# Patient Record
Sex: Female | Born: 1950 | Race: White | Hispanic: No | Smoking: Never smoker
Health system: Southern US, Community
[De-identification: ages and names within clinical notes are randomized; demographics above are authoritative.]

## PROBLEM LIST (undated history)

## (undated) DIAGNOSIS — Z8679 Personal history of other diseases of the circulatory system: Secondary | ICD-10-CM

## (undated) DIAGNOSIS — R002 Palpitations: Secondary | ICD-10-CM

## (undated) DIAGNOSIS — I10 Essential (primary) hypertension: Secondary | ICD-10-CM

## (undated) HISTORY — DX: Personal history of other diseases of the circulatory system: Z86.79

## (undated) HISTORY — DX: Essential (primary) hypertension: I10

## (undated) HISTORY — PX: TONSILLECTOMY: SUR1361

## (undated) HISTORY — DX: Palpitations: R00.2

---

## 2005-05-05 ENCOUNTER — Ambulatory Visit: Payer: Self-pay | Admitting: Cardiology

## 2005-05-27 ENCOUNTER — Ambulatory Visit: Payer: Self-pay

## 2005-05-27 ENCOUNTER — Encounter: Payer: Self-pay | Admitting: Internal Medicine

## 2008-02-09 ENCOUNTER — Encounter: Payer: Self-pay | Admitting: Cardiology

## 2008-02-09 ENCOUNTER — Ambulatory Visit: Payer: Self-pay | Admitting: Cardiology

## 2008-02-09 DIAGNOSIS — R002 Palpitations: Secondary | ICD-10-CM | POA: Insufficient documentation

## 2008-02-09 DIAGNOSIS — Z8679 Personal history of other diseases of the circulatory system: Secondary | ICD-10-CM | POA: Insufficient documentation

## 2008-02-09 DIAGNOSIS — I10 Essential (primary) hypertension: Secondary | ICD-10-CM | POA: Insufficient documentation

## 2008-02-16 ENCOUNTER — Encounter: Payer: Self-pay | Admitting: Cardiology

## 2008-03-18 ENCOUNTER — Encounter: Payer: Self-pay | Admitting: Cardiology

## 2008-03-18 ENCOUNTER — Ambulatory Visit: Payer: Self-pay | Admitting: Cardiology

## 2008-03-26 ENCOUNTER — Ambulatory Visit: Payer: Self-pay | Admitting: Cardiology

## 2008-03-26 LAB — CONVERTED CEMR LAB
CO2: 34 meq/L — ABNORMAL HIGH (ref 19–32)
Chloride: 102 meq/L (ref 96–112)
Glucose, Bld: 87 mg/dL (ref 70–99)
Potassium: 3.9 meq/L (ref 3.5–5.1)
Sodium: 143 meq/L (ref 135–145)

## 2008-05-14 ENCOUNTER — Ambulatory Visit: Payer: Self-pay | Admitting: Cardiology

## 2009-01-13 ENCOUNTER — Telehealth (INDEPENDENT_AMBULATORY_CARE_PROVIDER_SITE_OTHER): Payer: Self-pay | Admitting: *Deleted

## 2010-02-05 NOTE — Progress Notes (Signed)
Summary: Record request from [facility]  Request for records received from [facility]. Request forwarded to [facility]. Wilder Glade  January 13, 2009 12:49 PM

## 2010-05-24 ENCOUNTER — Other Ambulatory Visit: Payer: Self-pay | Admitting: Cardiology

## 2010-06-29 ENCOUNTER — Other Ambulatory Visit: Payer: Self-pay | Admitting: Cardiology

## 2011-04-15 ENCOUNTER — Ambulatory Visit
Admission: RE | Admit: 2011-04-15 | Discharge: 2011-04-15 | Disposition: A | Discharge status: Home / Self Care | Payer: Commercial Managed Care - PPO | Source: Ambulatory Visit | Attending: Family Medicine | Admitting: Family Medicine

## 2011-04-15 ENCOUNTER — Other Ambulatory Visit: Payer: Self-pay | Admitting: Family Medicine

## 2011-04-15 DIAGNOSIS — R52 Pain, unspecified: Secondary | ICD-10-CM

## 2011-04-20 ENCOUNTER — Other Ambulatory Visit: Payer: Self-pay | Admitting: Cardiology

## 2011-04-28 ENCOUNTER — Other Ambulatory Visit: Payer: Self-pay | Admitting: Family Medicine

## 2011-04-28 DIAGNOSIS — M79604 Pain in right leg: Secondary | ICD-10-CM

## 2011-04-28 DIAGNOSIS — M79605 Pain in left leg: Secondary | ICD-10-CM

## 2011-05-04 ENCOUNTER — Ambulatory Visit
Admission: RE | Admit: 2011-05-04 | Discharge: 2011-05-04 | Disposition: A | Discharge status: Home / Self Care | Payer: Commercial Managed Care - PPO | Source: Ambulatory Visit | Attending: Family Medicine | Admitting: Family Medicine

## 2011-05-04 DIAGNOSIS — M79604 Pain in right leg: Secondary | ICD-10-CM

## 2011-05-31 ENCOUNTER — Other Ambulatory Visit: Payer: Self-pay | Admitting: Cardiology

## 2012-01-12 ENCOUNTER — Other Ambulatory Visit: Payer: Self-pay | Admitting: Cardiology

## 2012-05-22 ENCOUNTER — Other Ambulatory Visit: Payer: Self-pay | Admitting: Cardiology

## 2012-05-23 NOTE — Telephone Encounter (Signed)
Haven't seen pt since 2010 last note states come back prn. Per Deliah Goody, Dr. Jens Som nurse if only coming back prn ok to send to [facility] MD will send a message to Pharmacy stating to refill through [facility]

## 2012-05-25 ENCOUNTER — Other Ambulatory Visit: Payer: Self-pay | Admitting: Cardiology

## 2012-05-26 ENCOUNTER — Telehealth: Payer: Self-pay | Admitting: *Deleted

## 2012-05-26 NOTE — Telephone Encounter (Signed)
Patient calling back to see if Dr Jens Som will refill her Metoprolol. Sh ehas not been seen since 2010 and wants a call back to see if she have her refill or not. Will forward to nurse.      Cachet Mccutchen [employer]

## 2012-05-26 NOTE — Telephone Encounter (Signed)
Left message for pt, made her aware the office policy for refills. She will need to call and make an appt to get refills.

## 2012-05-30 ENCOUNTER — Other Ambulatory Visit: Payer: Self-pay | Admitting: *Deleted

## 2012-05-30 MED ORDER — METOPROLOL SUCCINATE ER 25 MG PO TB24: ORAL_TABLET | ORAL | Status: DC

## 2012-05-30 NOTE — Telephone Encounter (Signed)
F/u    Pt states she's returning your call about her refills

## 2012-05-30 NOTE — Telephone Encounter (Signed)
Left message for pt, she has an appt scheduled for June with dr Jens Som. Refills called to the pharm

## 2012-06-27 ENCOUNTER — Encounter: Payer: Self-pay | Admitting: Cardiovascular Disease

## 2012-06-27 ENCOUNTER — Encounter: Payer: Self-pay | Admitting: *Deleted

## 2012-06-28 ENCOUNTER — Encounter: Payer: Self-pay | Admitting: Cardiology

## 2012-06-28 ENCOUNTER — Ambulatory Visit (INDEPENDENT_AMBULATORY_CARE_PROVIDER_SITE_OTHER): Payer: BC Managed Care – PPO | Admitting: Cardiology

## 2012-06-28 VITALS — BP 115/80 | HR 55

## 2012-06-28 DIAGNOSIS — R002 Palpitations: Secondary | ICD-10-CM

## 2012-06-28 DIAGNOSIS — I10 Essential (primary) hypertension: Secondary | ICD-10-CM

## 2012-06-28 DIAGNOSIS — Z8679 Personal history of other diseases of the circulatory system: Secondary | ICD-10-CM

## 2012-06-28 MED ORDER — METOPROLOL SUCCINATE ER 25 MG PO TB24: ORAL_TABLET | ORAL | Status: AC

## 2012-06-28 NOTE — Assessment & Plan Note (Signed)
Blood pressure controlled. Continue present medications. 

## 2012-06-28 NOTE — Assessment & Plan Note (Signed)
Occasional mild symptoms but not particularly bothersome. Continue Toprol.

## 2012-06-28 NOTE — Assessment & Plan Note (Signed)
Not evident on most recent echo. 

## 2012-06-28 NOTE — Progress Notes (Signed)
  HPI: Julie Riggs for evaluation of hypertension and palpitations. I have seen her previously but not since May of 2010. We did perform an echocardiogram on the patient on February 16, 2008. LV function was normal. There were no significant valvular abnormalities noted. Patient has been on Toprol previously for her palpitations. She occasionally notes her heart rate to be elevated but not sustained and she does not have particularly bothersome palpitations. She denies dyspnea on exertion, orthopnea, PND, pedal edema, syncope or exertional chest pain.   Current Outpatient Prescriptions  Medication Sig Dispense Refill  . aspirin 81 MG tablet Take 81 mg by mouth daily.      . Cholecalciferol (VITAMIN D3) 2000 UNITS capsule Take 2,000 Units by mouth daily.      . citalopram (CELEXA) 10 MG tablet Take 10 mg by mouth daily.      . Cyanocobalamin (VITAMIN B-12 CR PO) Take 1 tablet by mouth daily.      . diclofenac (VOLTAREN) 50 MG EC tablet Take 50 mg by mouth as needed.      . docusate sodium (COLACE) 100 MG capsule Take 200 mg by mouth daily.      . hydrochlorothiazide (MICROZIDE) 12.5 MG capsule TAKE ONE CAPSULE EVERY DAY  90 capsule  2  . levocetirizine (XYZAL) 5 MG tablet Take 1 tablet by mouth daily.      . metoprolol succinate (TOPROL-XL) 25 MG 24 hr tablet TAKE 1 TABLET EVERY DAY  30 tablet  0  . vitamin C (ASCORBIC ACID) 500 MG tablet Take 500 mg by mouth daily.       No current facility-administered medications for this visit.    Allergies  Allergen Reactions  . Pseudoephedrine     REACTION: Heart racing    Past Medical History  Diagnosis Date  . HYPERTENSION, BENIGN   . Palpitations   . MITRAL VALVE PROLAPSE, HX OF     Past Surgical History  Procedure Laterality Date  . Tonsillectomy      History   Social History  . Marital Status: Widowed    Spouse Name: N/A    Number of Children: 2  . Years of Education: N/A   Occupational History  .     Social History Main  Topics  . Smoking status: Never Smoker   . Smokeless tobacco: Not on file  . Alcohol Use: Yes  . Drug Use: Not on file  . Sexually Active: Not on file   Other Topics Concern  . Not on file   Social History Narrative  . No narrative on file    Family History  Problem Relation Age of Onset  . Heart disease Mother     Valve replacement    ROS: no fevers or chills, productive cough, hemoptysis, dysphasia, odynophagia, melena, hematochezia, dysuria, hematuria, rash, seizure activity, orthopnea, PND, pedal edema, claudication. Remaining systems are negative.  Physical Exam:   Blood pressure 115/80, pulse 55.  General:  Well developed/well nourished in NAD Skin warm/dry Patient not depressed No peripheral clubbing Back-normal HEENT-normal/normal eyelids Neck supple/normal carotid upstroke bilaterally; no bruits; no JVD; no thyromegaly chest - CTA/ normal expansion CV - RRR/normal S1 and S2; no rubs or gallops;  PMI nondisplaced, 1/6 systolic ejection murmur Abdomen -NT/ND, no HSM, no mass, + bowel sounds, no bruit 2+ femoral pulses, no bruits Ext-no edema, chords, 2+ DP Neuro-grossly nonfocal  ECG sinus bradycardia at a rate of 55. RV conduction delay. Cannot rule out prior lateral infarct.

## 2012-06-28 NOTE — Patient Instructions (Addendum)
Your physician recommends that you schedule a follow-up appointment in: AS NEEDED  

## 2012-10-04 ENCOUNTER — Other Ambulatory Visit: Payer: Self-pay | Admitting: Cardiology

## 2013-06-10 IMAGING — CR DG LUMBAR SPINE COMPLETE 4+V
5 series · 5 of 5 positions shown · non-contrast
Comparison: None.

CLINICAL DATA: Hip and buttock pain radiating to the legs, no
injury

LUMBAR SPINE - COMPLETE 4+ VIEW

[t l-spine a.p.]
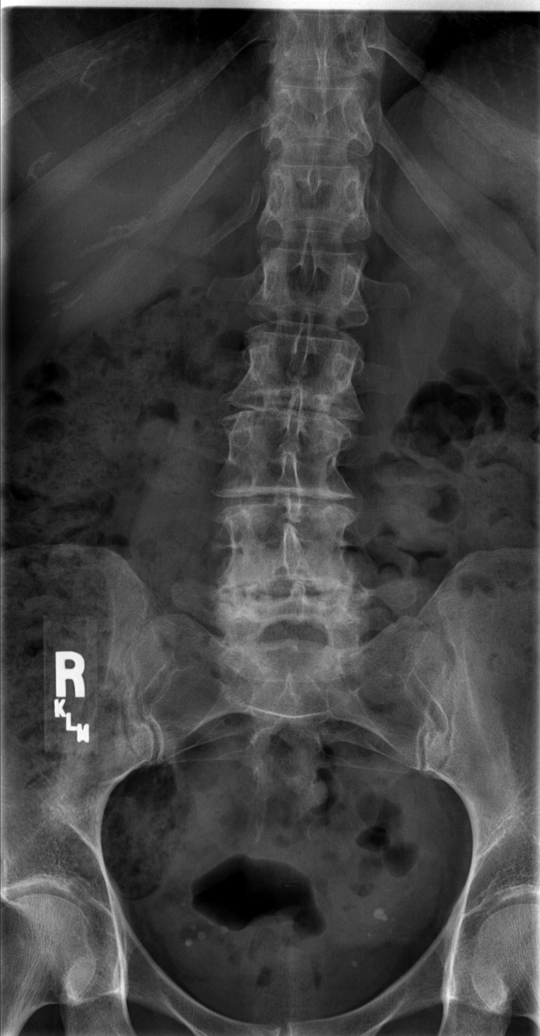

[t l-spine oblique exposure (1 of 2)]
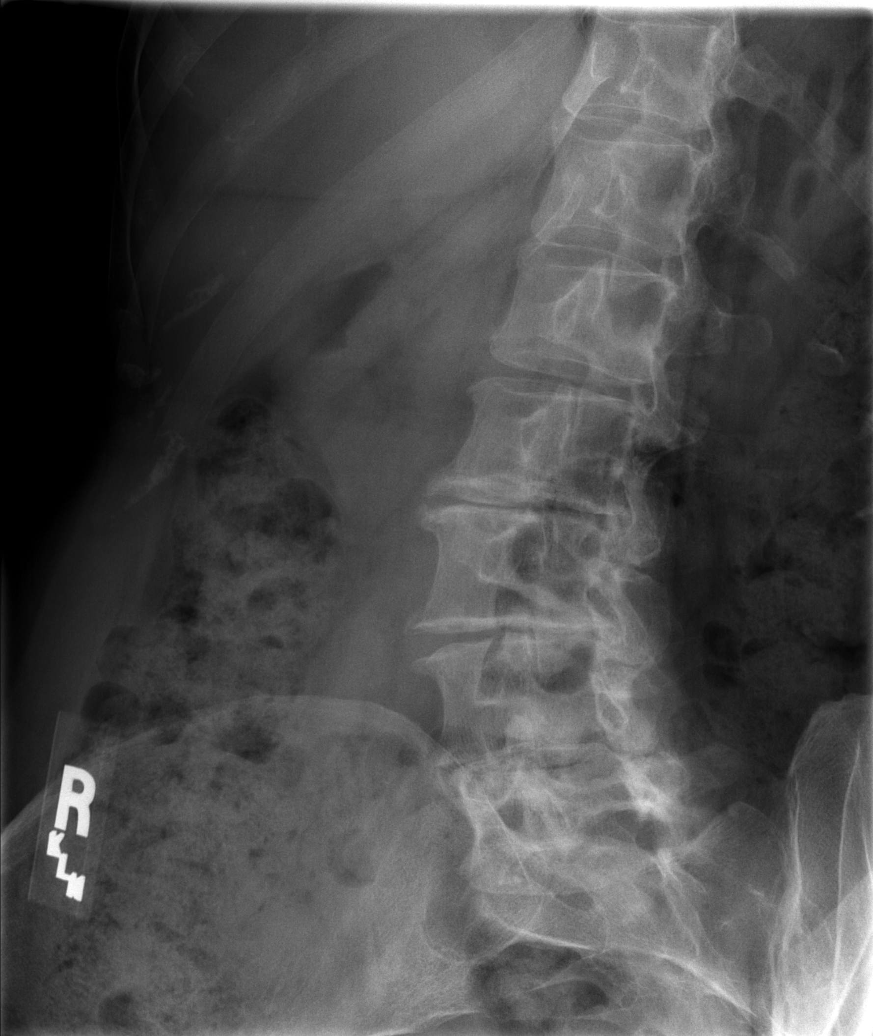

[t l-spine oblique exposure (2 of 2)]
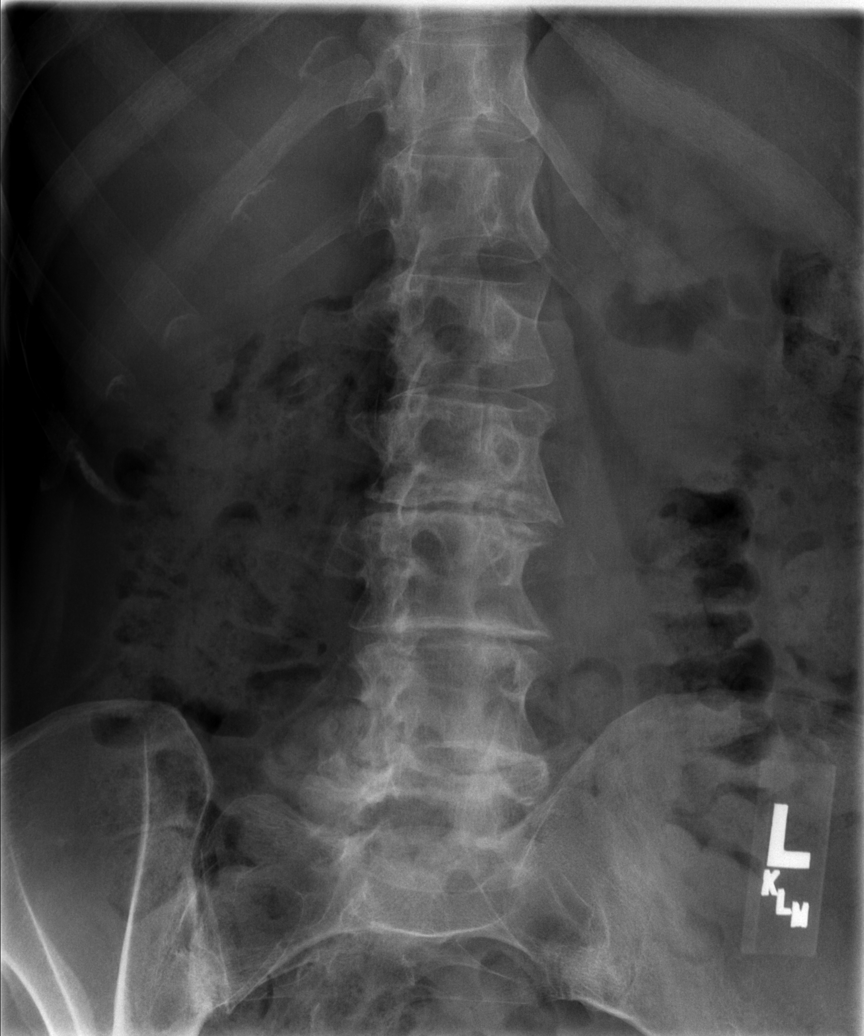

[t l-spine lat]
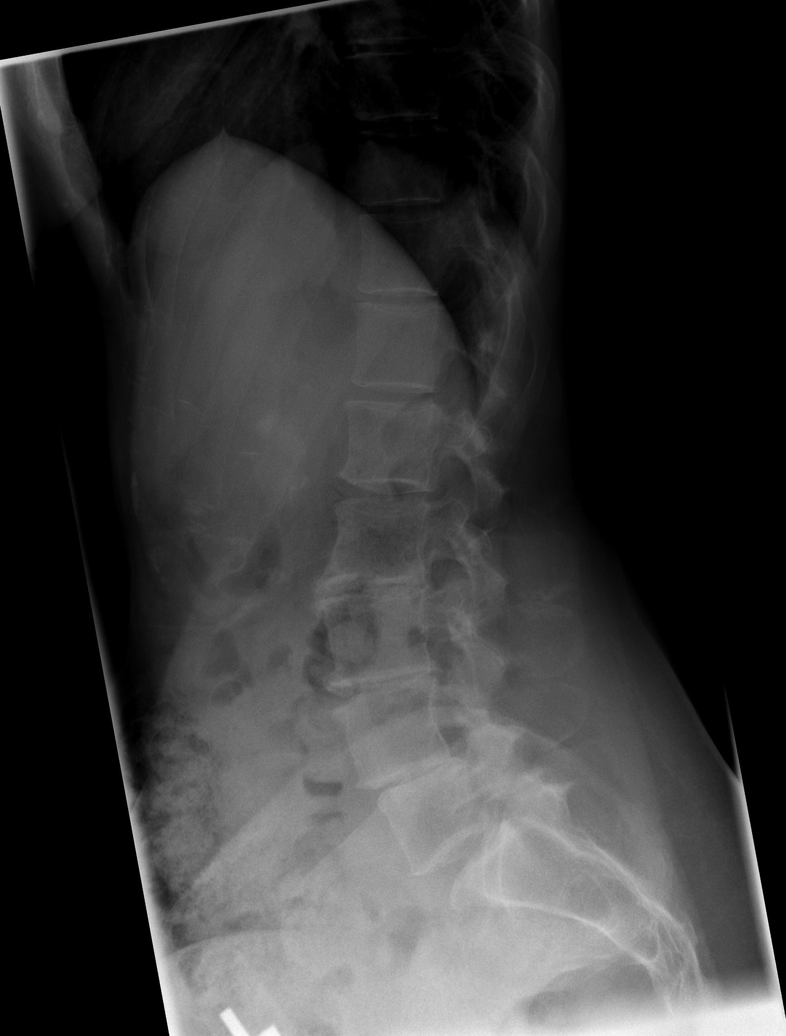

[t l-spine l5-s1 spot]
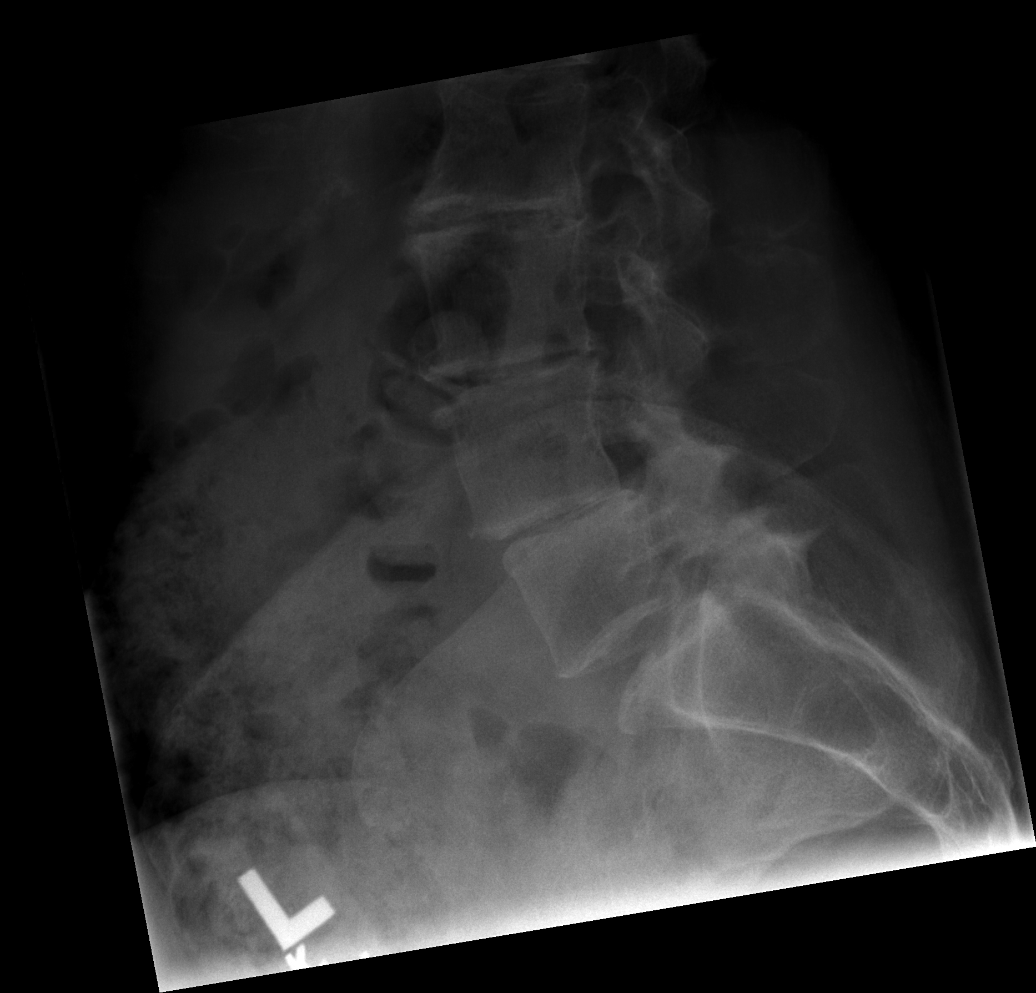

[5 of 5 positions shown; findings below may reference images not displayed]

FINDINGS: There is a mild anterolisthesis of L4 on L5 by 4 mm with
degenerative disc disease at L4-5.  Slightly more prominent
anterolisthesis of L5 on S1 is noted of 8 mm.  Both of these
malalignments appear to be due to degenerative change involving the
facet joints.  There is also degenerative disc disease at L2-3 and
L3-4 levels.  No compression deformity is seen.  The SI joints
appear normal.
IMPRESSION: 1.  Degenerative disc disease at L2-3, L3-4, and L4-5 levels.
2.  4 mm anterolisthesis of L4 on L5 with 8 mm anterolisthesis of
L5 on S1 most likely due to degenerative change of the facet
joints.

## 2013-07-03 ENCOUNTER — Other Ambulatory Visit: Payer: Self-pay | Admitting: *Deleted

## 2013-07-03 MED ORDER — HYDROCHLOROTHIAZIDE 12.5 MG PO CAPS: ORAL_CAPSULE | ORAL | Status: AC

## 2014-04-22 ENCOUNTER — Other Ambulatory Visit: Payer: Self-pay | Admitting: Cardiology

## 2014-04-24 NOTE — Telephone Encounter (Signed)
We have not seen her since 2014 and she is not following up with us.  No PCP in the chart. Will need to call patient and see if she has a PCP to refill for her.
